# Patient Record
Sex: Female | Born: 1985 | Race: Black or African American | Hispanic: No | Marital: Single | Smoking: Never smoker
Health system: Southern US, Community
[De-identification: ages and names within clinical notes are randomized; demographics above are authoritative.]

## PROBLEM LIST (undated history)

## (undated) DIAGNOSIS — E7800 Pure hypercholesterolemia, unspecified: Secondary | ICD-10-CM

## (undated) HISTORY — PX: TUBAL LIGATION: SHX77

## (undated) HISTORY — DX: Pure hypercholesterolemia, unspecified: E78.00

---
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
## 2003-01-12 ENCOUNTER — Emergency Department (HOSPITAL_COMMUNITY): Admission: EM | Admit: 2003-01-12 | Discharge: 2003-01-12 | Payer: Self-pay | Admitting: *Deleted

## 2003-01-14 ENCOUNTER — Inpatient Hospital Stay (HOSPITAL_COMMUNITY): Admission: AD | Admit: 2003-01-14 | Discharge: 2003-01-14 | Payer: Self-pay | Admitting: *Deleted

## 2003-01-17 ENCOUNTER — Inpatient Hospital Stay (HOSPITAL_COMMUNITY): Admission: AD | Admit: 2003-01-17 | Discharge: 2003-01-17 | Payer: Self-pay | Admitting: Obstetrics and Gynecology

## 2003-01-20 ENCOUNTER — Inpatient Hospital Stay (HOSPITAL_COMMUNITY): Admission: AD | Admit: 2003-01-20 | Discharge: 2003-01-20 | Payer: Self-pay | Admitting: Obstetrics and Gynecology

## 2003-01-24 ENCOUNTER — Inpatient Hospital Stay (HOSPITAL_COMMUNITY): Admission: AD | Admit: 2003-01-24 | Discharge: 2003-01-24 | Payer: Self-pay | Admitting: Obstetrics and Gynecology

## 2003-01-27 ENCOUNTER — Inpatient Hospital Stay (HOSPITAL_COMMUNITY): Admission: AD | Admit: 2003-01-27 | Discharge: 2003-01-27 | Payer: Self-pay | Admitting: Obstetrics and Gynecology

## 2003-02-02 ENCOUNTER — Inpatient Hospital Stay (HOSPITAL_COMMUNITY): Admission: AD | Admit: 2003-02-02 | Discharge: 2003-02-02 | Payer: Self-pay | Admitting: Obstetrics and Gynecology

## 2003-02-09 ENCOUNTER — Inpatient Hospital Stay (HOSPITAL_COMMUNITY): Admission: AD | Admit: 2003-02-09 | Discharge: 2003-02-09 | Payer: Self-pay | Admitting: Obstetrics and Gynecology

## 2003-02-16 ENCOUNTER — Inpatient Hospital Stay (HOSPITAL_COMMUNITY): Admission: AD | Admit: 2003-02-16 | Discharge: 2003-02-16 | Payer: Self-pay | Admitting: Obstetrics and Gynecology

## 2003-02-23 ENCOUNTER — Inpatient Hospital Stay (HOSPITAL_COMMUNITY): Admission: AD | Admit: 2003-02-23 | Discharge: 2003-02-23 | Payer: Self-pay | Admitting: Obstetrics and Gynecology

## 2003-07-18 ENCOUNTER — Emergency Department (HOSPITAL_COMMUNITY): Admission: EM | Admit: 2003-07-18 | Discharge: 2003-07-18 | Payer: Self-pay | Admitting: Emergency Medicine

## 2003-09-08 ENCOUNTER — Emergency Department (HOSPITAL_COMMUNITY): Admission: EM | Admit: 2003-09-08 | Discharge: 2003-09-09 | Payer: Self-pay | Admitting: Emergency Medicine

## 2004-04-20 ENCOUNTER — Emergency Department (HOSPITAL_COMMUNITY): Admission: EM | Admit: 2004-04-20 | Discharge: 2004-04-20 | Payer: Self-pay | Admitting: Emergency Medicine

## 2004-06-25 ENCOUNTER — Inpatient Hospital Stay (HOSPITAL_COMMUNITY): Admission: AD | Admit: 2004-06-25 | Discharge: 2004-06-25 | Payer: Self-pay | Admitting: *Deleted

## 2004-06-28 ENCOUNTER — Inpatient Hospital Stay (HOSPITAL_COMMUNITY): Admission: AD | Admit: 2004-06-28 | Discharge: 2004-06-28 | Payer: Self-pay | Admitting: Obstetrics

## 2004-07-05 ENCOUNTER — Ambulatory Visit (HOSPITAL_COMMUNITY): Admission: RE | Admit: 2004-07-05 | Discharge: 2004-07-05 | Payer: Self-pay | Admitting: Obstetrics

## 2004-07-18 ENCOUNTER — Emergency Department (HOSPITAL_COMMUNITY): Admission: EM | Admit: 2004-07-18 | Discharge: 2004-07-18 | Payer: Self-pay | Admitting: Family Medicine

## 2004-10-03 ENCOUNTER — Ambulatory Visit (HOSPITAL_COMMUNITY): Admission: RE | Admit: 2004-10-03 | Discharge: 2004-10-03 | Payer: Self-pay | Admitting: Obstetrics & Gynecology

## 2005-03-01 ENCOUNTER — Ambulatory Visit (HOSPITAL_COMMUNITY): Admission: RE | Admit: 2005-03-01 | Discharge: 2005-03-01 | Payer: Self-pay | Admitting: Obstetrics & Gynecology

## 2005-03-03 ENCOUNTER — Inpatient Hospital Stay (HOSPITAL_COMMUNITY): Admission: AD | Admit: 2005-03-03 | Discharge: 2005-03-05 | Payer: Self-pay | Admitting: Obstetrics & Gynecology

## 2005-03-03 ENCOUNTER — Inpatient Hospital Stay (HOSPITAL_COMMUNITY): Admission: AD | Admit: 2005-03-03 | Discharge: 2005-03-03 | Payer: Self-pay | Admitting: Obstetrics & Gynecology

## 2005-09-16 ENCOUNTER — Ambulatory Visit (HOSPITAL_COMMUNITY): Admission: RE | Admit: 2005-09-16 | Discharge: 2005-09-16 | Payer: Self-pay | Admitting: Obstetrics & Gynecology

## 2006-04-02 ENCOUNTER — Inpatient Hospital Stay (HOSPITAL_COMMUNITY): Admission: AD | Admit: 2006-04-02 | Discharge: 2006-04-04 | Payer: Self-pay | Admitting: Obstetrics & Gynecology

## 2006-07-04 ENCOUNTER — Ambulatory Visit (HOSPITAL_COMMUNITY): Admission: RE | Admit: 2006-07-04 | Discharge: 2006-07-04 | Payer: Self-pay | Admitting: Obstetrics & Gynecology

## 2007-04-16 ENCOUNTER — Emergency Department (HOSPITAL_COMMUNITY): Admission: EM | Admit: 2007-04-16 | Discharge: 2007-04-16 | Payer: Self-pay | Admitting: Family Medicine

## 2007-04-24 ENCOUNTER — Emergency Department (HOSPITAL_COMMUNITY): Admission: EM | Admit: 2007-04-24 | Discharge: 2007-04-24 | Payer: Self-pay | Admitting: Emergency Medicine

## 2008-04-27 ENCOUNTER — Encounter: Admission: RE | Admit: 2008-04-27 | Discharge: 2008-07-26 | Payer: Self-pay | Admitting: Obstetrics & Gynecology

## 2008-06-20 ENCOUNTER — Emergency Department (HOSPITAL_COMMUNITY): Admission: EM | Admit: 2008-06-20 | Discharge: 2008-06-20 | Payer: Self-pay | Admitting: Emergency Medicine

## 2009-06-14 ENCOUNTER — Ambulatory Visit: Payer: Self-pay | Admitting: Vascular Surgery

## 2009-07-08 ENCOUNTER — Encounter: Admission: RE | Admit: 2009-07-08 | Discharge: 2009-07-08 | Payer: Self-pay | Admitting: Neurology

## 2009-07-19 ENCOUNTER — Ambulatory Visit: Payer: Self-pay | Admitting: Psychology

## 2009-11-21 ENCOUNTER — Ambulatory Visit: Payer: Self-pay | Admitting: Psychology

## 2010-04-01 ENCOUNTER — Encounter: Payer: Self-pay | Admitting: Obstetrics

## 2010-04-01 ENCOUNTER — Encounter: Payer: Self-pay | Admitting: Obstetrics & Gynecology

## 2010-06-20 LAB — POCT PREGNANCY, URINE: Preg Test, Ur: NEGATIVE

## 2010-07-24 NOTE — Consult Note (Signed)
NEW PATIENT CONSULTATION   Little, Rebecca K  DOB:  11/18/1985                                       06/14/2009  CHART#:[id number]   The patient presents today for evaluation of prominent telangiectasia in  her right popliteal fossa.  She reports these have been present for many  years and have become increasingly discomforting to her over time.  She  reports there is some discomfort over these with prolonged standing.  She does not have any history of bleeding from these, does not have any  history of DVT, superficial thrombophlebitis or swelling.  She reports  that the longer she stands the more discomfort and pressure she has over  this area.   PAST MEDICAL HISTORY:  Her past history is significant for elevated  cholesterol, otherwise negative.   SOCIAL HISTORY:  She is single.  She has three children.  She does not  smoke or drink alcohol.   REVIEW OF SYSTEMS:  Positive for weight gain up to 250 pounds.  She is 5  feet 9 inches tall.  Denies cardiac, pulmonary, GI, GU symptoms.  Does  report pain in her legs with prolonged standing.  Neurologic,  musculoskeletal, psychiatric, ENT and hematologic is also negative.   FAMILY HISTORY:  She has no family history of premature atherosclerotic  disease.   PHYSICAL EXAM:  Vital signs:  Blood pressure is 138/88, pulse 72,  respirations 18.  General:  She is in no acute distress.  HEENT:  Normal.  Musculoskeletal:  Shows no major deformities or cyanosis.  Neurological:  No focal weakness or paresthesias.  Skin:  Without ulcers  or rashes.  She does have 2+ radial and 2+ dorsalis pedis pulses  bilaterally.  She has scattered telangiectasia over the medial thighs  bilaterally.  She does have an area of very prominent telangiectasia in  her right popliteal fossa on the more lateral aspect.  These are  distended above the skin.   She underwent noninvasive vascular laboratory studies in our office and  I reviewed  this with the patient.  This shows some mild reflux at the  mid portion of her great saphenous vein with no reflux at her  saphenofemoral junction bilaterally.  These areas of telangiectasia do  not appear to arise from the area of her reflux.   I explained the significance of all this to the patient and I explained  this does not make her more prone to more serious complications of  venous hypertension.  I did explain the possibility of treating this  with sclerotherapy to reduce the distention of these veins.  Since they  are under the skin I explained there is no surgical treatment for this.  I did explain the out-of-pocket expense for the sclerotherapy and she  will consider this and let us know if she wishes to proceed.     Larina Earthly, M.D.  Electronically Signed   TFE/MEDQ  D:  06/14/2009  T:  06/15/2009  Job:  [id number]

## 2010-07-24 NOTE — Procedures (Signed)
LOWER EXTREMITY VENOUS REFLUX EXAM   INDICATION:  Lower extremity varicose veins.   EXAM:  Using color-flow imaging and pulse Doppler spectral analysis, the  bilateral common femoral, superficial femoral, popliteal, posterior  tibial, greater and lesser saphenous veins are evaluated.  There is  evidence suggesting deep venous insufficiency in the bilateral common  femoral vein levels with reflux of >500 milliseconds.   The right saphenofemoral junction is competent. The left saphenofemoral  junction is not competent with reflux of >500 milliseconds.  The  bilateral GSV's are not competent with Reflux of >518milliseconds with  the caliber as described below.   The bilateral proximal short saphenous veins demonstrate competency.   GSV Diameter (used if found to be incompetent only)                                            Right          Left  Proximal Greater Saphenous Vein           0.46 cm        0.3 cm  Proximal-to-mid-thigh                     cm             cm  Mid thigh                                 0.40 cm        0.40 cm  Mid-distal thigh                          cm             cm  Distal thigh                              0.41 cm        0.45 cm  Knee                                      0.35 cm        0.49 cm   IMPRESSION:  1. Bilateral greater saphenous vein reflux is noted, as described      above.  2. The bilateral greater saphenous vein are not tortuous.  3. Bilateral deep venous reflux is noted, as described above.  4. The bilateral short saphenous veins demonstrate competency.   ___________________________________________  Larina Earthly, M.D.   CH/MEDQ  D:  06/14/2009  T:  06/14/2009  Job:  [id number]

## 2010-07-27 NOTE — Op Note (Signed)
NAME:  SHIELDS, Meleena             ACCOUNT NO.:  [id number]   MEDICAL RECORD NO.:  [id number]          PATIENT TYPE:  AMB   LOCATION:  SDC                           FACILITY:  [facility]   PHYSICIAN:  Roseanna Rainbow, M.D.DATE OF BIRTH:  08-11-1985   DATE OF PROCEDURE:  07/04/2006  DATE OF DISCHARGE:                               OPERATIVE REPORT   PREOPERATIVE DIAGNOSIS:  Multiparity, desires sterilization procedure.   POSTOPERATIVE DIAGNOSIS:  Multiparity, desires sterilization procedure.   PROCEDURE:  Laparoscopic bilateral tubal ligation with fulguration.   SURGEON:  Jackson-Moore.   ANESTHESIA:  General endotracheal.   ESTIMATED BLOOD LOSS:  Minimal.   COMPLICATIONS:  None.   PROCEDURE:  The patient was taken to the operating room with an IV  running.  She was given general anesthesia, placed in the dorsal  lithotomy position and prepped and draped in the usual sterile fashion.  A bivalve speculum was placed in the patient's vagina.  The anterior lip  of the cervix was grasped with a single-tooth tenaculum.  A Hulka  manipulator was then advanced into the uterus and secured to the  anterior lip of the cervix as a means to manipulate the uterus.  The  single-tooth tenaculum and speculum were then removed.  An  infraumbilical skin incision was then made with a scalpel.  The Veress  needle was then introduced into the intra-abdominal cavity while tenting  up the anterior abdominal wall at a 45-degree angle.  Intra-abdominal  placement was confirmed by saline drop test, and an appropriate low  pressure reading upon insufflation of the abdomen with CO2 gas.  The  abdomen was insufflated to 4 liters.  The Veress needle was then  removed.  The trocar and sleeve were then advanced into the abdomen  where intra-abdominal placement again was confirmed with the  laparoscope.  A survey of the anatomy revealed filmy ovarian adhesions  through the broad ligament and also filmy  perihepatic adhesions.  The  right fallopian tube was then followed out to its fimbriated end.  The  tube was regrasped at the mid isthmic portion.  A 2-cm segment of tube  in the mid isthmic portion was then contiguously cauterized using the  bipolar Kleppinger.  With each application, the ohmmeter was noted to go  to zero.  The left fallopian tube was manipulated in a similar fashion.  All of the instruments were removed from the abdomen.  The skin was  closed with interrupted sutures of 3-0 Vicryl and Dermabond.  The Hulka  manipulator was removed from the vagina with minimal bleeding noted from  the cervix.  At the close of the procedure, the instrument and pack  counts were said to be correct x2.  The patient was taken to the PACU  awake and in stable condition.     Roseanna Rainbow, M.D.  Electronically Signed    LAJ/MEDQ  D:  07/04/2006  T:  07/04/2006  Job:  [id number]

## 2010-11-30 LAB — POCT PREGNANCY, URINE
Operator id: 116391
Operator id: 247071
Preg Test, Ur: NEGATIVE
Preg Test, Ur: NEGATIVE

## 2011-07-03 ENCOUNTER — Ambulatory Visit (INDEPENDENT_AMBULATORY_CARE_PROVIDER_SITE_OTHER): Payer: Self-pay | Admitting: *Deleted

## 2011-07-03 ENCOUNTER — Encounter: Payer: Self-pay | Admitting: Vascular Surgery

## 2011-07-03 ENCOUNTER — Encounter: Payer: Self-pay | Admitting: *Deleted

## 2011-07-03 DIAGNOSIS — I781 Nevus, non-neoplastic: Secondary | ICD-10-CM | POA: Insufficient documentation

## 2011-07-03 NOTE — Progress Notes (Signed)
Addended by: Clementeen Hoof on: 07/03/2011 11:17 AM   Modules accepted: Orders

## 2011-07-03 NOTE — Progress Notes (Signed)
X=.3% Sotradecol administered with a 27g butterfly.  Patient received a total of 6cc.  Only able to treat a few areas with one syringe but got most troublesome ones. Will follow this nice lady prn.  Photos: yes  Compression stockings applied: yes

## 2011-07-24 ENCOUNTER — Ambulatory Visit: Payer: Self-pay | Admitting: *Deleted

## 2012-12-23 ENCOUNTER — Encounter: Payer: Self-pay | Admitting: Obstetrics & Gynecology

## 2012-12-23 ENCOUNTER — Ambulatory Visit (INDEPENDENT_AMBULATORY_CARE_PROVIDER_SITE_OTHER): Payer: Medicaid Other | Admitting: Obstetrics & Gynecology

## 2012-12-23 VITALS — BP 134/71 | HR 60 | Temp 97.3°F | Ht 67.0 in | Wt 246.0 lb

## 2012-12-23 DIAGNOSIS — Z01419 Encounter for gynecological examination (general) (routine) without abnormal findings: Secondary | ICD-10-CM

## 2012-12-23 DIAGNOSIS — Z0000 Encounter for general adult medical examination without abnormal findings: Secondary | ICD-10-CM

## 2012-12-23 NOTE — Progress Notes (Signed)
Subjective:     Rebecca Little is a 27 y.o. female here for a routine exam.  Current complaints: annual exam.  Personal health questionnaire reviewed: yes.   Gynecologic History Patient's last menstrual period was 12/09/2012. Contraception: tubal ligation Last Pap:10/24/ 2013. Results were: normal   Obstetric History OB History  No data available     The following portions of the patient's history were reviewed and updated as appropriate: allergies, current medications, past family history, past medical history, past social history, past surgical history and problem list.  Review of Systems Pertinent items are noted in HPI.    Objective:      General appearance: alert Breasts: normal appearance, no masses or tenderness Abdomen: soft, non-tender; bowel sounds normal; no masses,  no organomegaly Pelvic: cervix normal in appearance, external genitalia normal, no adnexal masses or tenderness, uterus normal size, shape, and consistency and vagina normal without discharge       Assessment:    Healthy female exam.    Plan:   Return prn

## 2012-12-23 NOTE — Patient Instructions (Signed)

## 2013-01-27 ENCOUNTER — Ambulatory Visit: Payer: Medicaid Other | Admitting: Obstetrics & Gynecology

## 2013-04-07 ENCOUNTER — Ambulatory Visit (INDEPENDENT_AMBULATORY_CARE_PROVIDER_SITE_OTHER): Payer: Self-pay | Admitting: Obstetrics & Gynecology

## 2013-04-07 ENCOUNTER — Encounter: Payer: Self-pay | Admitting: Obstetrics & Gynecology

## 2013-04-07 VITALS — BP 143/84 | HR 71 | Temp 98.7°F | Ht 67.0 in | Wt 247.0 lb

## 2013-04-07 DIAGNOSIS — Z0000 Encounter for general adult medical examination without abnormal findings: Secondary | ICD-10-CM

## 2013-04-07 NOTE — Progress Notes (Signed)
Subjective:     Rebecca Little is a 28 y.o. female here for a routine exam.  Current complaints: Patient in office today for annual exam. Patient denies any concerns  Personal health questionnaire reviewed: yes.   Gynecologic History Patient's last menstrual period was 04/01/2013. Contraception: tubal ligation Last Pap: 12/2011. Results were: normal   Obstetric History OB History  Gravida Para Term Preterm AB SAB TAB Ectopic Multiple Living  4 3 3  1   1  3     # Outcome Date GA Lbr Len/2nd Weight Sex Delivery Anes PTL Lv  4 TRM 04/02/06   7 lb 7 oz (3.374 kg) F SVD None  Y  3 TRM 03/03/05   7 lb 9 oz (3.43 kg) M SVD EPI  Y  2 TRM 05/14/96   8 lb 11 oz (3.941 kg) M SVD EPI  Y  1 ECT         N       The following portions of the patient's history were reviewed and updated as appropriate: allergies, current medications, past family history, past medical history, past social history, past surgical history and problem list.  Review of Systems A comprehensive review of systems was negative.    Objective:    BP 143/84  Pulse 71  Temp(Src) 98.7 F (37.1 C)  Ht 5\' 7"  (1.702 m)  Wt 247 lb (112.038 kg)  BMI 38.68 kg/m2  LMP 04/01/2013      General appearance: alert Breasts: normal appearance, no masses or tenderness Abdomen: soft, non-tender; bowel sounds normal; no masses,  no organomegaly Pelvic: cervix normal in appearance, external genitalia normal, no adnexal masses or tenderness, uterus normal size, shape, and consistency and vagina normal without discharge    Assessment:    Healthy female exam.    Plan:     Follow up PRN.

## 2013-04-08 ENCOUNTER — Encounter: Payer: Self-pay | Admitting: Obstetrics & Gynecology

## 2013-04-08 LAB — RPR

## 2013-04-08 LAB — HIV ANTIBODY (ROUTINE TESTING W REFLEX): HIV: NONREACTIVE

## 2013-04-08 NOTE — Patient Instructions (Signed)

## 2013-04-09 LAB — PAP IG, CT-NG NAA, HPV HIGH-RISK
Chlamydia Probe Amp: NEGATIVE
GC Probe Amp: NEGATIVE
HPV DNA High Risk: NOT DETECTED

## 2013-11-29 ENCOUNTER — Other Ambulatory Visit: Payer: Self-pay | Admitting: Occupational Medicine

## 2013-11-29 ENCOUNTER — Ambulatory Visit: Payer: Self-pay

## 2013-11-29 DIAGNOSIS — R52 Pain, unspecified: Secondary | ICD-10-CM

## 2014-01-10 ENCOUNTER — Encounter: Payer: Self-pay | Admitting: Obstetrics & Gynecology

## 2014-03-07 ENCOUNTER — Encounter: Payer: Self-pay | Admitting: *Deleted

## 2014-03-08 ENCOUNTER — Encounter: Payer: Self-pay | Admitting: Obstetrics & Gynecology

## 2014-04-07 ENCOUNTER — Ambulatory Visit: Payer: Medicaid Other | Admitting: Obstetrics & Gynecology

## 2019-04-07 ENCOUNTER — Other Ambulatory Visit: Payer: Self-pay

## 2019-04-07 ENCOUNTER — Telehealth: Payer: Self-pay | Admitting: *Deleted

## 2019-04-07 ENCOUNTER — Ambulatory Visit (INDEPENDENT_AMBULATORY_CARE_PROVIDER_SITE_OTHER): Payer: Self-pay | Admitting: Podiatry

## 2019-04-07 VITALS — BP 116/74 | HR 79 | Temp 97.2°F

## 2019-04-07 DIAGNOSIS — M722 Plantar fascial fibromatosis: Secondary | ICD-10-CM

## 2019-04-07 DIAGNOSIS — L309 Dermatitis, unspecified: Secondary | ICD-10-CM

## 2019-04-07 MED ORDER — TERBINAFINE HCL 250 MG PO TABS: ORAL_TABLET | ORAL | 0 refills | Status: AC

## 2019-04-07 MED ORDER — TERBINAFINE HCL 250 MG PO TABS: ORAL_TABLET | ORAL | 0 refills | Status: DC

## 2019-04-07 MED ORDER — DICLOFENAC SODIUM 75 MG PO TBEC: 75.0000 mg | DELAYED_RELEASE_TABLET | Freq: Two times a day (BID) | ORAL | 2 refills | Status: AC

## 2019-04-07 NOTE — Progress Notes (Signed)
Subjective:   Patient ID: Rebecca Little, female   DOB: 34 y.o.   MRN: [id number]   HPI Patient presents stating she has had pain in her right heel and then also is concerned about some nails which are thin and generalized peeling of her skin bilateral   Review of Systems  All other systems reviewed and are negative.       Objective:  Physical Exam Vitals and nursing note reviewed.  Constitutional:      Appearance: She is well-developed.  Pulmonary:     Effort: Pulmonary effort is normal.  Musculoskeletal:        General: Normal range of motion.  Skin:    General: Skin is warm.  Neurological:     Mental Status: She is alert.     Neurovascular status found to be intact muscle strength found to be adequate range of motion within normal limits.  Patient has several problems with 1 being acute discomfort plantar aspect right heel at the insertional point tendon calcaneus with inflammation and secondarily found to have dry skin and nail disease that are thin on the hallux but no indications of significant yellow discoloration.  Patient has good digital perfusion well oriented x3    Assessment:  Acute plantar fasciitis right with probable dermatitis with possibility for fungal infection of the nailbeds     Plan:  H&P reviewed both conditions and did sterile prep injected the plantar fascial right 3 mg Kenalog 5 mg Xylocaine and placed her on a Lamisil pulse pack to see if this will make a difference with the irritation she gets of her skin explaining it may just be dry skin and she will continue to use moisturizer.  Patient will be seen back to recheck as needed

## 2019-04-07 NOTE — Telephone Encounter (Signed)
Pt called states Dr. Charlsie Merles was to send in two prescriptions, the pharmacy states terbinafine is not there. I reviewed Meds and Orders and [facility] confirmed electronically the terbinafine had been received 04/07/2019 at 11:33am. I told pt it had been confirmed received by her pharmacy, but I would send it again. I reviewed Meds and Orders, terbinafine was confirmed received by the pharmacy at 1:45pm today.

## 2019-11-06 ENCOUNTER — Encounter (HOSPITAL_BASED_OUTPATIENT_CLINIC_OR_DEPARTMENT_OTHER): Payer: Self-pay | Admitting: Emergency Medicine

## 2019-11-06 ENCOUNTER — Emergency Department (HOSPITAL_BASED_OUTPATIENT_CLINIC_OR_DEPARTMENT_OTHER)
Admission: EM | Admit: 2019-11-06 | Discharge: 2019-11-06 | Disposition: A | Discharge status: Home / Self Care | Payer: HRSA Program | Attending: Emergency Medicine | Admitting: Emergency Medicine

## 2019-11-06 ENCOUNTER — Other Ambulatory Visit: Payer: Self-pay

## 2019-11-06 DIAGNOSIS — U071 COVID-19: Secondary | ICD-10-CM | POA: Diagnosis not present

## 2019-11-06 DIAGNOSIS — Z79899 Other long term (current) drug therapy: Secondary | ICD-10-CM | POA: Insufficient documentation

## 2019-11-06 DIAGNOSIS — M791 Myalgia, unspecified site: Secondary | ICD-10-CM | POA: Insufficient documentation

## 2019-11-06 DIAGNOSIS — R0602 Shortness of breath: Secondary | ICD-10-CM | POA: Diagnosis present

## 2019-11-06 LAB — SARS CORONAVIRUS 2 BY RT PCR (HOSPITAL ORDER, PERFORMED IN ~~LOC~~ HOSPITAL LAB): SARS Coronavirus 2: POSITIVE — AB

## 2019-11-06 MED ORDER — BENZONATATE 100 MG PO CAPS: 100.0000 mg | ORAL_CAPSULE | Freq: Three times a day (TID) | ORAL | 0 refills | Status: AC

## 2019-11-06 MED ORDER — ACETAMINOPHEN 325 MG PO TABS
650.0000 mg | ORAL_TABLET | Freq: Once | ORAL | Status: AC | PRN
  Administered 2019-11-06: 650 mg via ORAL
  Filled 2019-11-06: qty 2

## 2019-11-06 MED ORDER — ONDANSETRON 4 MG PO TBDP: 4.0000 mg | ORAL_TABLET | Freq: Three times a day (TID) | ORAL | 0 refills | Status: AC | PRN

## 2019-11-06 MED ORDER — ONDANSETRON 4 MG PO TBDP
4.0000 mg | ORAL_TABLET | Freq: Once | ORAL | Status: AC | PRN
  Administered 2019-11-06: 4 mg via ORAL
  Filled 2019-11-06: qty 1

## 2019-11-06 NOTE — Discharge Instructions (Signed)
Please read and follow all provided instructions.  Your diagnoses today include:  1. COVID-19    Tests performed today include:  Vital signs. See below for your results today.   Medications prescribed:   Tessalon Perles - cough suppressant medication  Take any prescribed medications only as directed.  Home care instructions:  Follow any educational materials contained in this packet.  BE VERY CAREFUL not to take multiple medicines containing Tylenol (also called acetaminophen). Doing so can lead to an overdose which can damage your liver and cause liver failure and possibly death.   Follow-up instructions: Please follow-up with your primary care provider as needed for further evaluation of your symptoms.   I will send your information to the monoclonal antibody infusion clinic.  You may receive a phone call from them in the next day or 2 regarding scheduling appointment.  Return instructions:   Please return to the Emergency Department if you experience worsening symptoms.   Return with worsening shortness of breath, trouble breathing, increased work of breathing or persistent vomiting.  Please return if you have any other emergent concerns.  Additional Information:  Your vital signs today were: BP 112/61 (BP Location: Left Arm)    Pulse 91    Temp 98.5 F (36.9 C) (Oral)    Resp (!) 22    Ht 5\' 7"  (1.702 m)    Wt 120.2 kg    SpO2 96%    BMI 41.50 kg/m  If your blood pressure (BP) was elevated above 135/85 this visit, please have this repeated by your doctor within one month. --------------

## 2019-11-06 NOTE — ED Notes (Signed)
Emmit Alexanders, PA ED Provider at bedside.

## 2019-11-06 NOTE — ED Provider Notes (Signed)
[facility] EMERGENCY DEPARTMENT Provider Note   CSN: [id number] Arrival date & time: 11/06/19  1611     History Chief Complaint  Patient presents with  . Shortness of Breath    Rebecca Little is a 34 y.o. female.  Patient with no past pulmonary history presents the emergency department for nausea, decreased appetitie, SOB, and cough.  Symptoms started about 3 days ago.  She has several children at home who have tested positive for coronavirus.  She has had temperature to 100.7 F here.  She denies vomiting or diarrhea.  She has been using Robitussin and chewing gum at home for her cough.  She has not received covid vaccine.         Past Medical History:  Diagnosis Date  . High cholesterol     Patient Active Problem List   Diagnosis Date Noted  . 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 TUBAL LIGATION       OB History    Gravida  4   Para  3   Term  3   Preterm      AB  1   Living  3     SAB      TAB      Ectopic  1   Multiple      Live Births  3           Family History  Problem Relation Age of Onset  . Diabetes Mother   . Hyperlipidemia Mother   . Diabetes Father     Social History   Tobacco Use  . Smoking status: Never Smoker  . Smokeless tobacco: Never Used  Substance Use Topics  . Alcohol use: Yes    Comment: Rarely   . Drug use: No    Home Medications Prior to Admission medications   Medication Sig Start Date End Date Taking? Authorizing Provider  diclofenac (VOLTAREN) 75 MG EC tablet Take 1 tablet (75 mg total) by mouth 2 (two) times daily. 04/07/19   Lenn Sink, DPM  terbinafine (LAMISIL) 250 MG tablet Please take one a day x 7days, repeat every 4 weeks x 4 months 04/07/19   Lenn Sink, DPM    Allergies    Patient has no known allergies.  Review of Systems   Review of Systems  Constitutional: Positive for chills, fatigue and fever.  HENT:  Positive for congestion. Negative for rhinorrhea and sore throat.   Eyes: Negative for redness.  Respiratory: Positive for cough and shortness of breath.   Cardiovascular: Negative for chest pain.  Gastrointestinal: Positive for nausea. Negative for abdominal pain, diarrhea and vomiting.  Genitourinary: Negative for dysuria, frequency, hematuria and urgency.  Musculoskeletal: Positive for myalgias.  Skin: Negative for rash.  Neurological: Negative for headaches.    Physical Exam Updated Vital Signs BP 122/86 (BP Location: Left Arm)   Pulse (!) 124   Temp (!) 100.7 F (38.2 C) (Oral)   Resp 20   Ht 5\' 7"  (1.702 m)   Wt 120.2 kg   SpO2 98%   BMI 41.50 kg/m   Physical Exam Vitals and nursing note reviewed.  Constitutional:      Appearance: She is well-developed.  HENT:     Head: Normocephalic and atraumatic.     Jaw: No trismus.     Right Ear: Tympanic membrane, ear canal and external ear normal.     Left Ear: Tympanic membrane, ear canal  and external ear normal.     Nose: Nose normal. No mucosal edema or rhinorrhea.     Mouth/Throat:     Mouth: Mucous membranes are not dry. No oral lesions.     Pharynx: Uvula midline. No oropharyngeal exudate, posterior oropharyngeal erythema or uvula swelling.     Tonsils: No tonsillar abscesses.  Eyes:     General:        Right eye: No discharge.        Left eye: No discharge.     Conjunctiva/sclera: Conjunctivae normal.  Cardiovascular:     Rate and Rhythm: Normal rate and regular rhythm.     Heart sounds: Normal heart sounds.     Comments: Heart rate normal after resolution of fever. Pulmonary:     Effort: Pulmonary effort is normal. No respiratory distress.     Breath sounds: Normal breath sounds. No wheezing or rales.     Comments: Lungs are clear on exam. Abdominal:     Palpations: Abdomen is soft.     Tenderness: There is no abdominal tenderness.  Musculoskeletal:     Cervical back: Normal range of motion and neck supple.    Lymphadenopathy:     Cervical: No cervical adenopathy.  Skin:    General: Skin is warm and dry.  Neurological:     Mental Status: She is alert.     ED Results / Procedures / Treatments   Labs (all labs ordered are listed, but only abnormal results are displayed) Labs Reviewed  SARS CORONAVIRUS 2 BY RT PCR (HOSPITAL ORDER, PERFORMED IN [facility]) - Abnormal; Notable for the following components:      Result Value   SARS Coronavirus 2 POSITIVE (*)    All other components within normal limits    EKG None  Radiology No results found.  Procedures Procedures (including critical care time)  Medications Ordered in ED Medications  acetaminophen (TYLENOL) tablet 650 mg (650 mg Oral Given 11/06/19 1624)  ondansetron (ZOFRAN-ODT) disintegrating tablet 4 mg (4 mg Oral Given 11/06/19 1624)    ED Course  I have reviewed the triage vital signs and the nursing notes.  Pertinent labs & imaging results that were available during my care of the patient were reviewed by me and considered in my medical decision making (see chart for details).  Patient seen and examined.  Patient has tested positive for COVID-19 and has been informed.  Overall she is doing well with the infection to this point.  Symptoms are mild.  She has not hypoxic.  Tachycardia improved with improvement in temperature.  No vomiting here.  Patient be discharged home with isolation and strict return precautions.  She is noncommittal in regards to monoclonal antibodies, however would like to be contacted by the clinic.  She qualifies based on her BMI of 41.   Vital signs reviewed and are as follows: BP 112/61 (BP Location: Left Arm)   Pulse 91   Temp 98.5 F (36.9 C) (Oral)   Resp (!) 22   Ht 5\' 7"  (1.702 m)   Wt 120.2 kg   SpO2 96%   BMI 41.50 kg/m   Discussed return to the emergency department with worsening shortness of breath, increased work of breathing, persistent vomiting, worsening symptoms or  other concerns.    MDM Rules/Calculators/A&P                          Patient with mild COVID-19 infection.  Doubt  significant pneumonia.  Vital signs reassuring.   Final Clinical Impression(s) / ED Diagnoses Final diagnoses:  COVID-19    Rx / DC Orders ED Discharge Orders         Ordered    benzonatate (TESSALON) 100 MG capsule  Every 8 hours        11/06/19 1939           Renne Crigler, Cordelia Poche 11/06/19 1940    Sabas Sous, MD 11/06/19 2259

## 2019-11-06 NOTE — ED Triage Notes (Addendum)
SOB and nausea since yesterday. Endorses cough. Her children are COVID+

## 2019-11-07 ENCOUNTER — Telehealth: Payer: Self-pay | Admitting: Physician Assistant

## 2019-11-07 NOTE — Telephone Encounter (Signed)
  Called to discuss with patient about Covid symptoms and the use of casirivimab/imdevimab, a monoclonal antibody infusion for those with mild to moderate Covid symptoms and at a high risk of hospitalization.  Pt is qualified for this infusion at the [facility] center due   High BMI.    Unable to left message to call back our hotline as voicemail is full.  Manson Passey, PA - C

## 2019-11-08 ENCOUNTER — Emergency Department (HOSPITAL_COMMUNITY): Payer: HRSA Program

## 2019-11-08 ENCOUNTER — Emergency Department (HOSPITAL_COMMUNITY)
Admission: EM | Admit: 2019-11-08 | Discharge: 2019-11-08 | Disposition: A | Discharge status: Home / Self Care | Payer: HRSA Program | Attending: Emergency Medicine | Admitting: Emergency Medicine

## 2019-11-08 ENCOUNTER — Other Ambulatory Visit: Payer: Self-pay

## 2019-11-08 ENCOUNTER — Encounter (HOSPITAL_COMMUNITY): Payer: Self-pay

## 2019-11-08 DIAGNOSIS — U071 COVID-19: Secondary | ICD-10-CM

## 2019-11-08 DIAGNOSIS — Z79899 Other long term (current) drug therapy: Secondary | ICD-10-CM | POA: Insufficient documentation

## 2019-11-08 DIAGNOSIS — R0602 Shortness of breath: Secondary | ICD-10-CM | POA: Diagnosis present

## 2019-11-08 LAB — CBC WITH DIFFERENTIAL/PLATELET
Abs Immature Granulocytes: 0.01 10*3/uL (ref 0.00–0.07)
Basophils Absolute: 0 10*3/uL (ref 0.0–0.1)
Basophils Relative: 0 %
Eosinophils Absolute: 0 10*3/uL (ref 0.0–0.5)
Eosinophils Relative: 0 %
HCT: 47.1 % — ABNORMAL HIGH (ref 36.0–46.0)
Hemoglobin: 15.6 g/dL — ABNORMAL HIGH (ref 12.0–15.0)
Immature Granulocytes: 0 %
Lymphocytes Relative: 48 %
Lymphs Abs: 1.5 10*3/uL (ref 0.7–4.0)
MCH: 29.6 pg (ref 26.0–34.0)
MCHC: 33.1 g/dL (ref 30.0–36.0)
MCV: 89.4 fL (ref 80.0–100.0)
Monocytes Absolute: 0.3 10*3/uL (ref 0.1–1.0)
Monocytes Relative: 11 %
Neutro Abs: 1.3 10*3/uL — ABNORMAL LOW (ref 1.7–7.7)
Neutrophils Relative %: 41 %
Platelets: 177 10*3/uL (ref 150–400)
RBC: 5.27 MIL/uL — ABNORMAL HIGH (ref 3.87–5.11)
RDW: 13.2 % (ref 11.5–15.5)
WBC: 3.1 10*3/uL — ABNORMAL LOW (ref 4.0–10.5)
nRBC: 0 % (ref 0.0–0.2)

## 2019-11-08 LAB — I-STAT CHEM 8, ED
BUN: 9 mg/dL (ref 6–20)
Calcium, Ion: 1.1 mmol/L — ABNORMAL LOW (ref 1.15–1.40)
Chloride: 102 mmol/L (ref 98–111)
Creatinine, Ser: 0.7 mg/dL (ref 0.44–1.00)
Glucose, Bld: 123 mg/dL — ABNORMAL HIGH (ref 70–99)
HCT: 43 % (ref 36.0–46.0)
Hemoglobin: 14.6 g/dL (ref 12.0–15.0)
Potassium: 4.1 mmol/L (ref 3.5–5.1)
Sodium: 138 mmol/L (ref 135–145)
TCO2: 26 mmol/L (ref 22–32)

## 2019-11-08 MED ORDER — ACETAMINOPHEN 325 MG PO TABS
650.0000 mg | ORAL_TABLET | Freq: Once | ORAL | Status: AC
  Administered 2019-11-08: 650 mg via ORAL
  Filled 2019-11-08: qty 2

## 2019-11-08 MED ORDER — ALBUTEROL SULFATE HFA 108 (90 BASE) MCG/ACT IN AERS
2.0000 | INHALATION_SPRAY | Freq: Once | RESPIRATORY_TRACT | Status: AC
  Administered 2019-11-08: 2 via RESPIRATORY_TRACT
  Filled 2019-11-08: qty 6.7

## 2019-11-08 MED ORDER — ONDANSETRON 4 MG PO TBDP: 4.0000 mg | ORAL_TABLET | Freq: Three times a day (TID) | ORAL | 0 refills | Status: AC | PRN

## 2019-11-08 MED ORDER — SODIUM CHLORIDE 0.9 % IV BOLUS
1000.0000 mL | Freq: Once | INTRAVENOUS | Status: AC
  Administered 2019-11-08: 1000 mL via INTRAVENOUS

## 2019-11-08 NOTE — ED Provider Notes (Signed)
[facility]-EMERGENCY DEPT Provider Note   CSN: [id number] Arrival date & time: 11/08/19  1727     History Chief Complaint  Patient presents with  . Shortness of Breath  . Nausea  . Cough  . Covid positive    Rebecca Little is a 34 y.o. female who presents to the ED today with complaint of COVID symptoms. Pt was seen in the ED on 08/28 after being exposed to her children who all tested positive. She was having symptoms of nausea, decreased appetite, shortness of breath, and cough with a fever in the ED of 100.7. Pt tested POSITIVE for COVID 19 and was discharged home. MAB was discussed with patient however she wanted to wait; it appears the infusion clinic attempted to contact patient however were unsuccessful in reaching her.   Patient returns to the ED today with continued symptoms. She states she has not felt like eating or drinking anything as she has a loss of taste. She has also felt extremely nauseated and had diarrhea; no vomiting. Pt also reports she feels short of breath and has continued to have a cough. Pt has not been checking her temperature at home however temp in the ED is 100.3.  The history is provided by the patient and medical records.       Past Medical History:  Diagnosis Date  . High cholesterol     Patient Active Problem List   Diagnosis Date Noted  . 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 TUBAL LIGATION       OB History    Gravida  4   Para  3   Term  3   Preterm      AB  1   Living  3     SAB      TAB      Ectopic  1   Multiple      Live Births  3           Family History  Problem Relation Age of Onset  . Diabetes Mother   . Hyperlipidemia Mother   . Diabetes Father     Social History   Tobacco Use  . Smoking status: Never Smoker  . Smokeless tobacco: Never Used  Vaping Use  . Vaping Use: Never used  Substance Use Topics  . Alcohol  use: Yes    Comment: Rarely   . Drug use: No    Home Medications Prior to Admission medications   Medication Sig Start Date End Date Taking? Authorizing Provider  benzonatate (TESSALON) 100 MG capsule Take 1 capsule (100 mg total) by mouth every 8 (eight) hours. 11/06/19   Renne Crigler, PA-C  diclofenac (VOLTAREN) 75 MG EC tablet Take 1 tablet (75 mg total) by mouth 2 (two) times daily. 04/07/19   Lenn Sink, DPM  ondansetron (ZOFRAN ODT) 4 MG disintegrating tablet Take 1 tablet (4 mg total) by mouth every 8 (eight) hours as needed for nausea or vomiting. 11/06/19   Renne Crigler, PA-C  ondansetron (ZOFRAN ODT) 4 MG disintegrating tablet Take 1 tablet (4 mg total) by mouth every 8 (eight) hours as needed for nausea or vomiting. 11/08/19   Tanda Rockers, PA-C  terbinafine (LAMISIL) 250 MG tablet Please take one a day x 7days, repeat every 4 weeks x 4 months 04/07/19   Lenn Sink, DPM    Allergies    Patient has no known allergies.  Review  of Systems   Review of Systems  Constitutional: Positive for chills, fatigue and fever.  Respiratory: Positive for cough and shortness of breath.   Gastrointestinal: Positive for diarrhea and nausea. Negative for vomiting.  Neurological: Positive for headaches.  All other systems reviewed and are negative.   Physical Exam Updated Vital Signs BP 125/89 (BP Location: Left Arm)   Pulse (!) 114   Temp 100.3 F (37.9 C) (Oral)   Resp 20   Ht 5\' 7"  (1.702 m)   Wt 120.2 kg   LMP 10/11/2019 (Approximate)   SpO2 93%   BMI 41.50 kg/m   Physical Exam Vitals and nursing note reviewed.  Constitutional:      Appearance: She is obese. She is not ill-appearing or diaphoretic.  HENT:     Head: Normocephalic and atraumatic.  Eyes:     Conjunctiva/sclera: Conjunctivae normal.  Cardiovascular:     Rate and Rhythm: Normal rate and regular rhythm.     Pulses: Normal pulses.  Pulmonary:     Effort: Pulmonary effort is normal. No tachypnea.      Breath sounds: Decreased breath sounds present. No wheezing, rhonchi or rales.  Abdominal:     Palpations: Abdomen is soft.     Tenderness: There is no abdominal tenderness. There is no guarding or rebound.  Musculoskeletal:     Cervical back: Neck supple.     Right lower leg: No edema.     Left lower leg: No edema.  Skin:    General: Skin is warm and dry.  Neurological:     Mental Status: She is alert.     ED Results / Procedures / Treatments   Labs (all labs ordered are listed, but only abnormal results are displayed) Labs Reviewed  CBC WITH DIFFERENTIAL/PLATELET - Abnormal; Notable for the following components:      Result Value   WBC 3.1 (*)    RBC 5.27 (*)    Hemoglobin 15.6 (*)    HCT 47.1 (*)    Neutro Abs 1.3 (*)    All other components within normal limits  I-STAT CHEM 8, ED - Abnormal; Notable for the following components:   Glucose, Bld 123 (*)    Calcium, Ion 1.10 (*)    All other components within normal limits    EKG None  Radiology DG Chest 2 View  Result Date: 11/08/2019 CLINICAL DATA:  Cough, shortness of breath and fever. EXAM: CHEST - 2 VIEW COMPARISON:  06/20/2008 chest radiograph FINDINGS: Patchy/hazy opacities within both LOWER lungs noted and suspicious for infection/pneumonia. The cardiomediastinal silhouette is unremarkable. No pleural effusion or pneumothorax. No acute bony abnormalities are noted. IMPRESSION: Patchy/hazy opacities within both LOWER lungs suspicious for infection/pneumonia. Electronically Signed   By: 08/20/2008 M.D.   On: 11/08/2019 18:48    Procedures Procedures (including critical care time)  Medications Ordered in ED Medications  acetaminophen (TYLENOL) tablet 650 mg (650 mg Oral Given 11/08/19 1919)  sodium chloride 0.9 % bolus 1,000 mL (1,000 mLs Intravenous New Bag/Given 11/08/19 1920)  albuterol (VENTOLIN HFA) 108 (90 Base) MCG/ACT inhaler 2 puff (2 puffs Inhalation Given 11/08/19 1919)    ED Course  I have  reviewed the triage vital signs and the nursing notes.  Pertinent labs & imaging results that were available during my care of the patient were reviewed by me and considered in my medical decision making (see chart for details).    MDM Rules/Calculators/A&P  34 year old female who presents to the ED today with COVID-19 symptoms, diagnosed 2 days ago with Covid after being around her children who all tested positive.  She is unvaccinated.  Reports that she continues to have loss of taste prompting her to not want to eat or drink as well as nausea and diarrhea.  She states she feels very fatigued.  On arrival to the ED patient is febrile at 100.3, cardiac at 114, nontachypneic.  She is satting 93% on room air.  I have personally ambulated patient with pulse ox on, sats remained above 90%.  He does appear dry on exam and I suspect this is likely due to dehydration given she has not been eating or drinking much.  Chest x-ray was obtained while patient was in the waiting room, has findings consistent with Covid pneumonia.  Dehydration will provide IV fluids at this time.  Will obtain lab work including CBC and BMP to assess kidney function.  Provide albuterol inhaler as patient does report shortness of breath.  I have discussed monoclonal antibodies with patient.  It does appear they try to reach out to patient earlier in the week however were unsuccessful.  Patient would still like to think about it.  Is currently on the 8 of her symptoms and she understands that in 2 days time she will no longer be a candidate.  CBC with leukopenia 3.1. Hgb 15.6/Hct 47.1 consistent with dehydration; pt receiving fluids  Unfortunately pt's BMP hemolyzed; I stat chem 8 has been ordered instead to prevent longer times in the ED. Creatinine 0.790, potassium 4.1, sodium 138.   HR has improved to the low 100s and O2 sats increased to 97% after albuterol inhaler. Will discharge patient home at this time.  Have given MAB clinic her information again to try to contact her to discuss receiving the infusion. Pt is in agreement with plan and stable for discharge home.   This note was prepared using Dragon voice recognition software and may include unintentional dictation errors due to the inherent limitations of voice recognition software.  Jannifer FranklinLatoyia K Little was evaluated in Emergency Department on 11/08/2019 for the symptoms described in the history of present illness. She was evaluated in the context of the global COVID-19 pandemic, which necessitated consideration that the patient might be at risk for infection with the SARS-CoV-2 virus that causes COVID-19. Institutional protocols and algorithms that pertain to the evaluation of patients at risk for COVID-19 are in a state of rapid change based on information released by regulatory bodies including the CDC and federal and state organizations. These policies and algorithms were followed during the patient's care in the ED.  Final Clinical Impression(s) / ED Diagnoses Final diagnoses:  COVID-19    Rx / DC Orders ED Discharge Orders         Ordered    ondansetron (ZOFRAN ODT) 4 MG disintegrating tablet  Every 8 hours PRN        11/08/19 2137           Discharge Instructions     Please use your albuterol inhaler as needed. You can use 2 puffs every 4 hours.  I have also prescribed nausea medication for you to take to help you eat and drink. Even if you are not hungry it is important to nourish your body with food. I would also recommend drinking plenty of water to stay hydrated Check your temperature and take Tylenol if your fever is < 100.4 I have provided the MAB  infusion clinic with your information again and they will reach out to you to discuss antibody infusions. Please keep your phone with you and answer any calls that you may not recognize as this could be them. You are not eligible after 10 days of symptoms (2 days from now).  Continue  quarantining as indicated Return to the ED for any worsening symptoms       Tanda Rockers, Cordelia Poche 11/08/19 2142    Sabas Sous, MD 11/08/19 2352

## 2019-11-08 NOTE — ED Notes (Signed)
ED Provider at bedside. 

## 2019-11-08 NOTE — Discharge Instructions (Signed)
Please use your albuterol inhaler as needed. You can use 2 puffs every 4 hours.  I have also prescribed nausea medication for you to take to help you eat and drink. Even if you are not hungry it is important to nourish your body with food. I would also recommend drinking plenty of water to stay hydrated Check your temperature and take Tylenol if your fever is < 100.4 I have provided the MAB infusion clinic with your information again and they will reach out to you to discuss antibody infusions. Please keep your phone with you and answer any calls that you may not recognize as this could be them. You are not eligible after 10 days of symptoms (2 days from now).  Continue quarantining as indicated Return to the ED for any worsening symptoms

## 2019-11-08 NOTE — ED Triage Notes (Signed)
Patient c/o cough, SOB, intermittent fever, and nausea x 1  Week. Patient states she was tested 2 days ago and was Covid +.

## 2019-11-09 ENCOUNTER — Telehealth (HOSPITAL_COMMUNITY): Payer: Self-pay | Admitting: Nurse Practitioner

## 2019-11-09 DIAGNOSIS — U071 COVID-19: Secondary | ICD-10-CM

## 2019-11-09 NOTE — Telephone Encounter (Signed)
Called to Discuss with patient about Covid symptoms and the use of regeneron, a monoclonal antibody infusion for those with mild to moderate Covid symptoms and at a high risk of hospitalization.     Pt is qualified for this infusion at the [facility] due to co-morbid conditions and/or a member of an at-risk group.     Unable to reach pt. VM full and unable to leave message. No mychart.   Consuello Masse, DNP, AGNP-C [phone] (Infusion Center Hotline)

## 2021-05-08 IMAGING — CR DG CHEST 2V
3 series · 3 of 3 positions shown · non-contrast
Comparison: 06/20/2008 chest radiograph

CLINICAL DATA: Cough, shortness of breath and fever.

EXAM:
CHEST - 2 VIEW

[w chest pa]
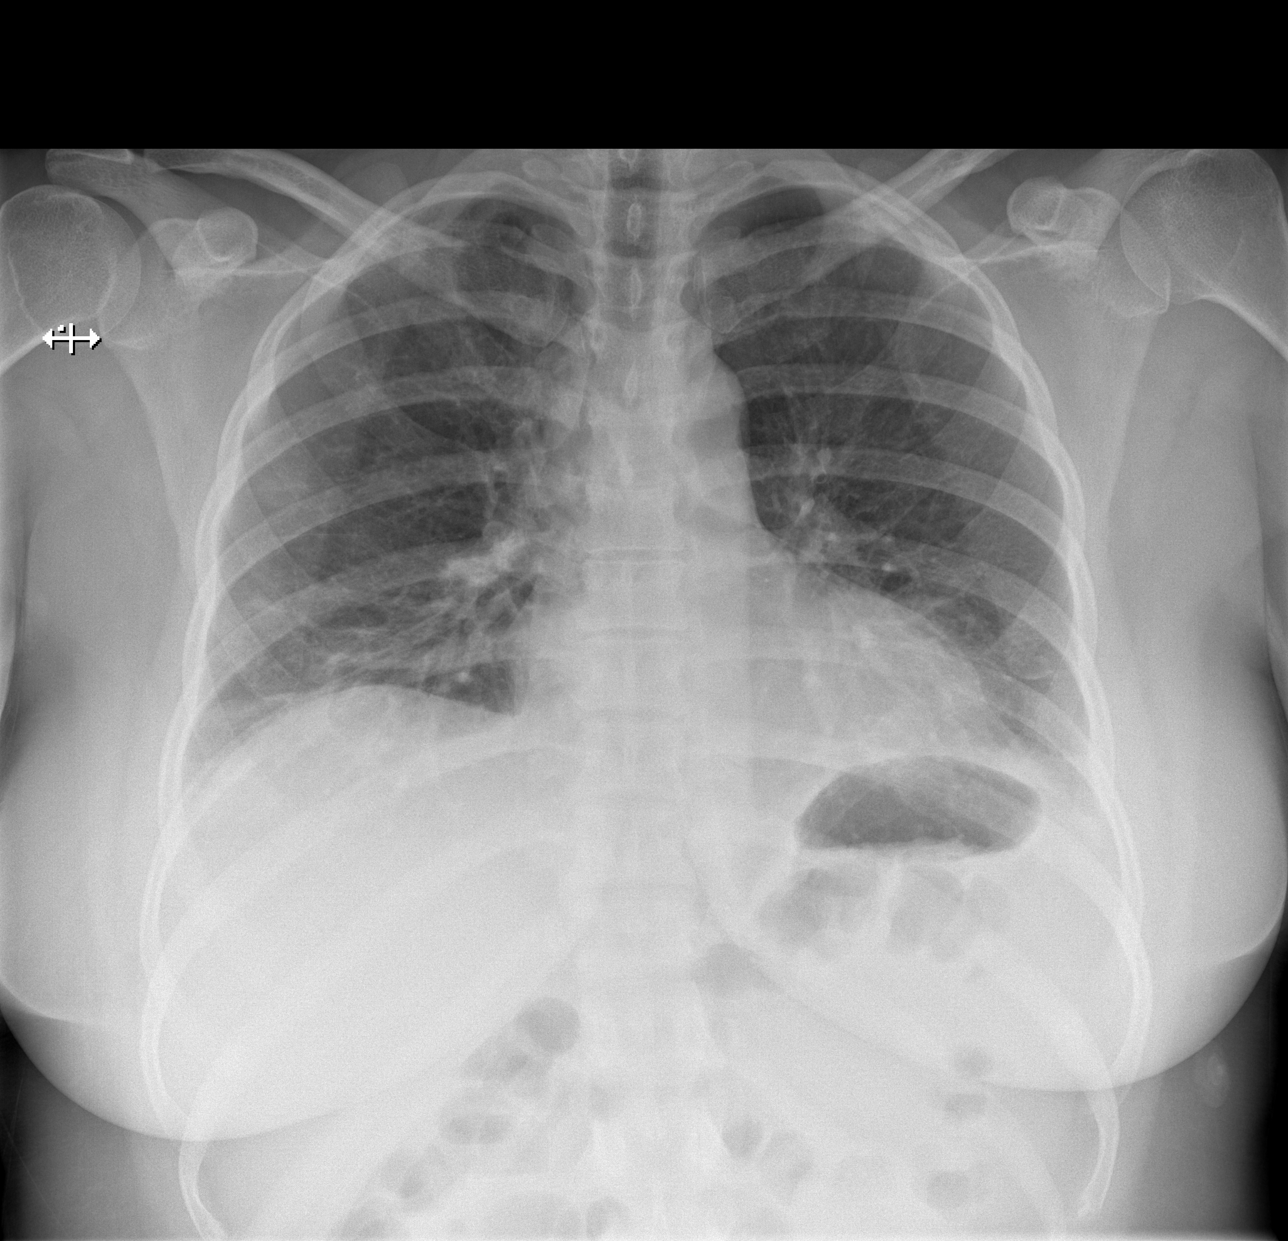

[w chest lat (1 of 2)]
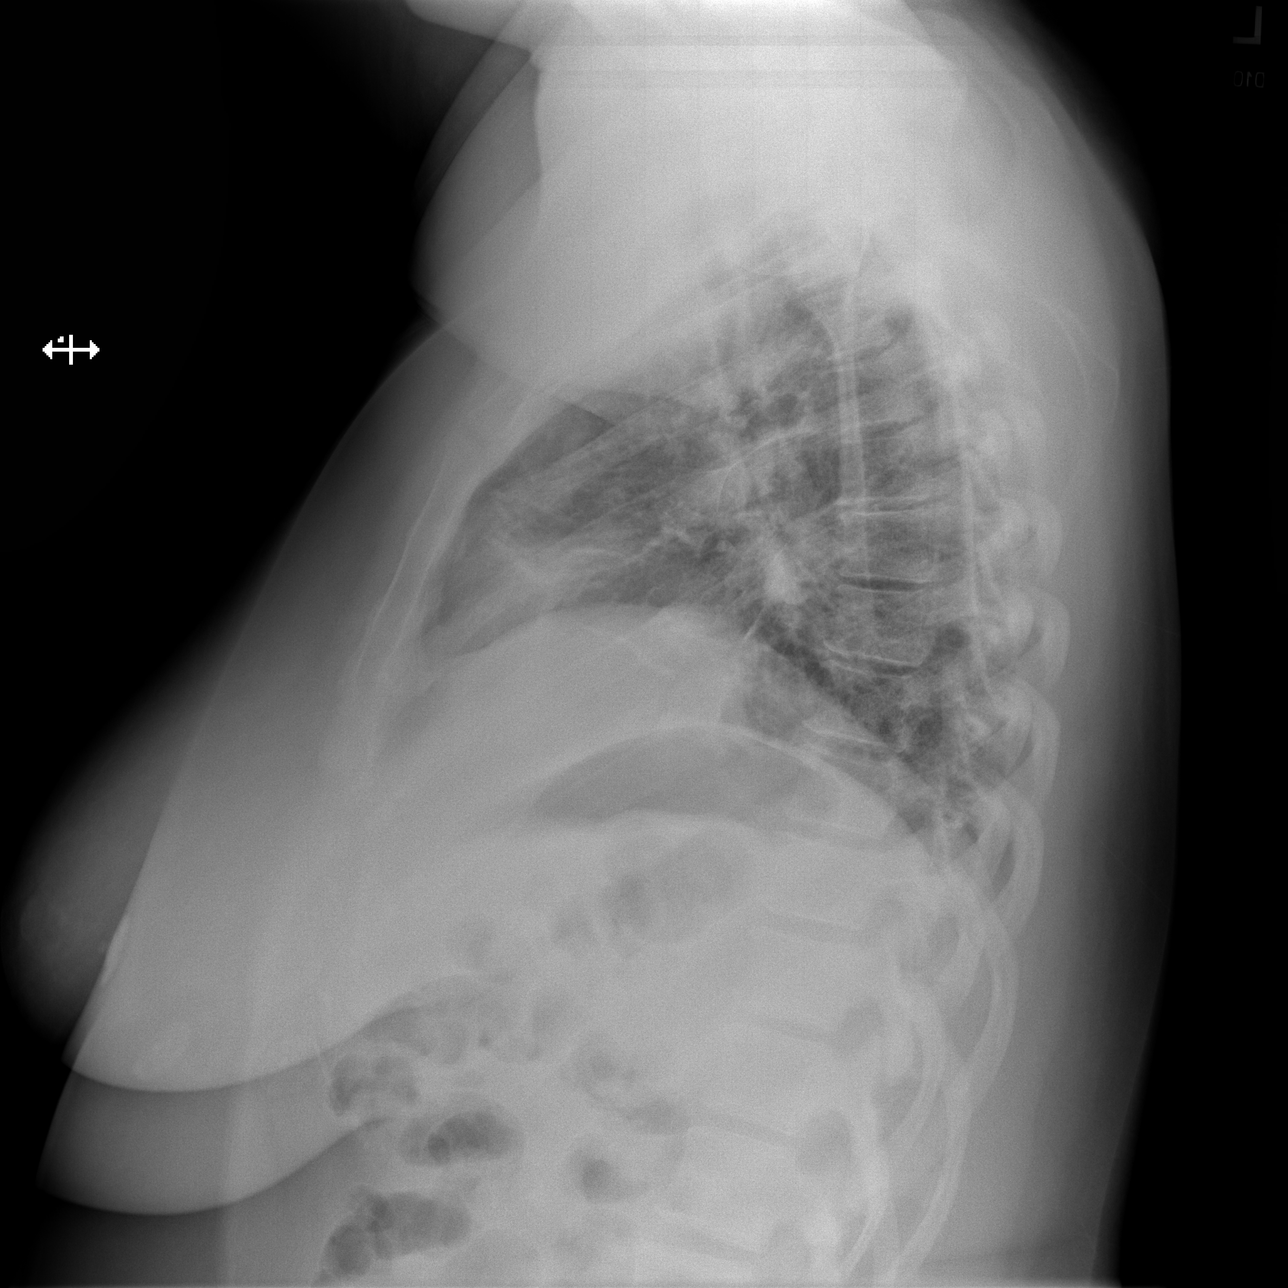

[w chest lat (2 of 2)]
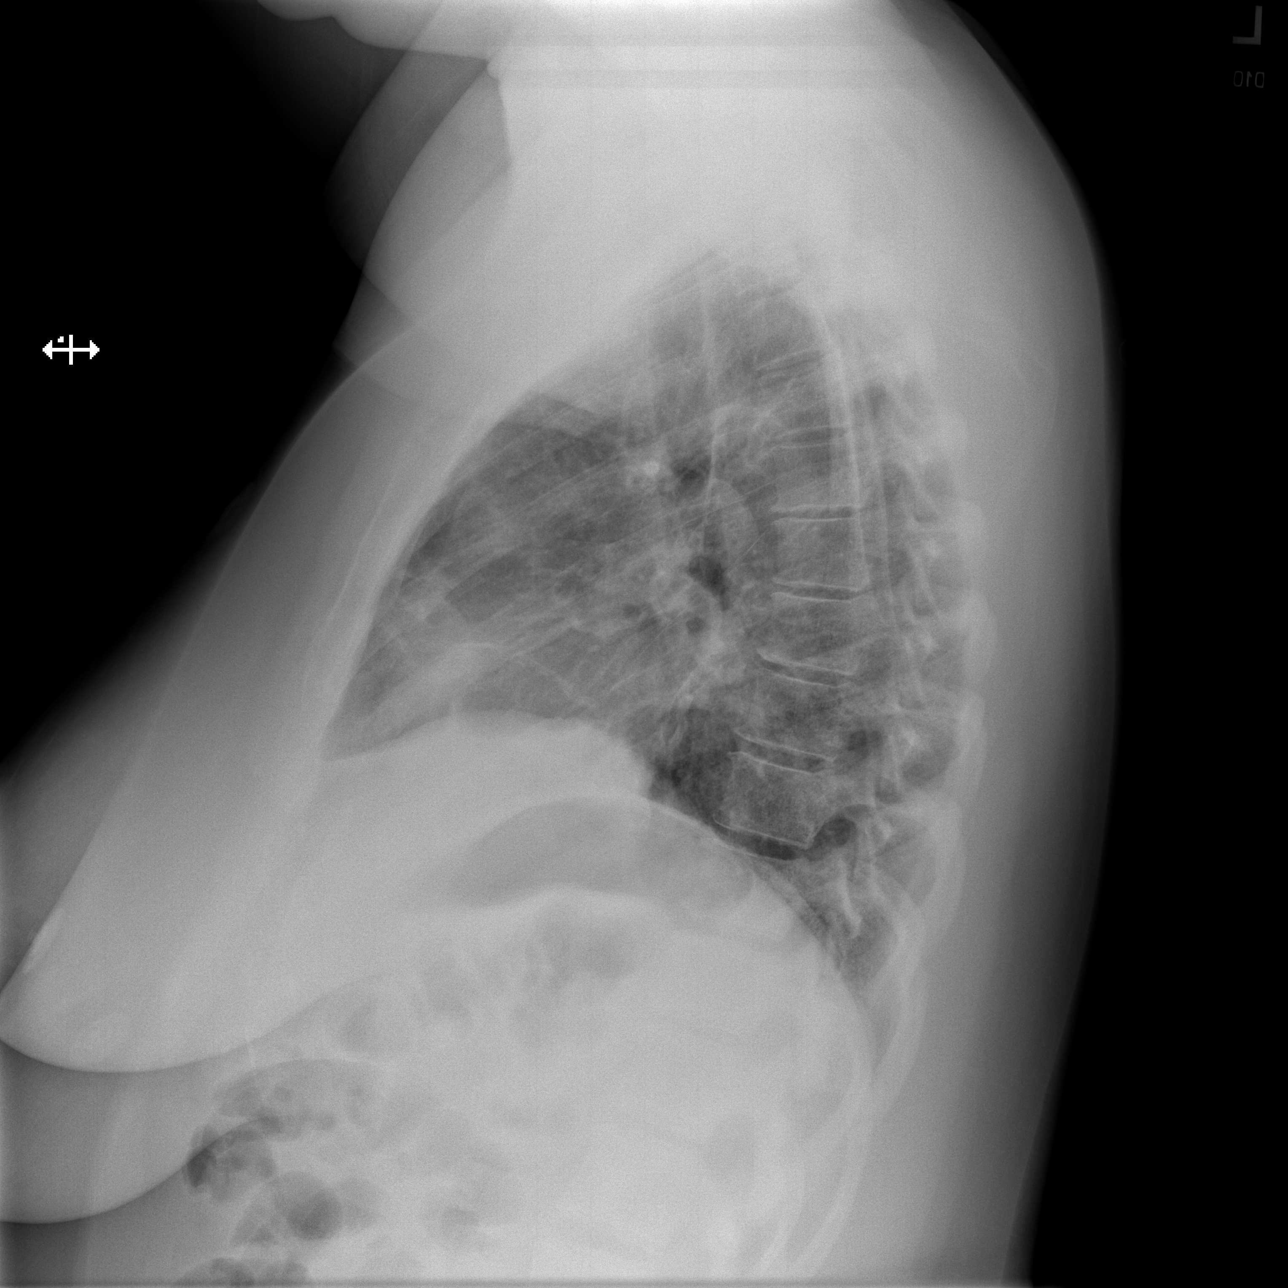

[3 of 3 positions shown; findings below may reference images not displayed]

FINDINGS: Patchy/hazy opacities within both LOWER lungs noted and suspicious
for infection/pneumonia.

The cardiomediastinal silhouette is unremarkable.

No pleural effusion or pneumothorax.

No acute bony abnormalities are noted.
IMPRESSION: Patchy/hazy opacities within both LOWER lungs suspicious for
infection/pneumonia.
